# Patient Record
Sex: Male | Born: 1980 | Race: White | Hispanic: No | Marital: Single | Smoking: Never smoker
Health system: Southern US, Community
[De-identification: ages and names within clinical notes are randomized; demographics above are authoritative.]

## PROBLEM LIST (undated history)

## (undated) HISTORY — PX: ANKLE SURGERY: SHX546

---

## 2013-11-26 HISTORY — PX: OTHER SURGICAL HISTORY: SHX169

## 2017-02-08 ENCOUNTER — Emergency Department (HOSPITAL_COMMUNITY)
Admission: EM | Admit: 2017-02-08 | Discharge: 2017-02-08 | Disposition: A | Discharge status: Home / Self Care | Payer: BC Managed Care – PPO | Attending: Emergency Medicine | Admitting: Emergency Medicine

## 2017-02-08 ENCOUNTER — Ambulatory Visit (HOSPITAL_COMMUNITY)
Admission: EM | Admit: 2017-02-08 | Discharge: 2017-02-08 | Disposition: A | Discharge status: Home / Self Care | Payer: Self-pay

## 2017-02-08 ENCOUNTER — Emergency Department (HOSPITAL_COMMUNITY): Payer: BC Managed Care – PPO

## 2017-02-08 ENCOUNTER — Encounter (HOSPITAL_COMMUNITY): Payer: Self-pay

## 2017-02-08 DIAGNOSIS — Y999 Unspecified external cause status: Secondary | ICD-10-CM | POA: Diagnosis not present

## 2017-02-08 DIAGNOSIS — Y929 Unspecified place or not applicable: Secondary | ICD-10-CM | POA: Insufficient documentation

## 2017-02-08 DIAGNOSIS — S0993XA Unspecified injury of face, initial encounter: Secondary | ICD-10-CM | POA: Insufficient documentation

## 2017-02-08 DIAGNOSIS — W2103XA Struck by baseball, initial encounter: Secondary | ICD-10-CM | POA: Diagnosis not present

## 2017-02-08 DIAGNOSIS — S0990XA Unspecified injury of head, initial encounter: Secondary | ICD-10-CM | POA: Diagnosis present

## 2017-02-08 DIAGNOSIS — Y9364 Activity, baseball: Secondary | ICD-10-CM | POA: Diagnosis not present

## 2017-02-08 DIAGNOSIS — S0003XA Contusion of scalp, initial encounter: Secondary | ICD-10-CM

## 2017-02-08 MED ORDER — HYDROCODONE-ACETAMINOPHEN 5-325 MG PO TABS
1.0000 | ORAL_TABLET | Freq: Once | ORAL | Status: AC
  Administered 2017-02-08: 1 via ORAL
  Filled 2017-02-08: qty 1

## 2017-02-08 MED ORDER — HYDROCODONE-ACETAMINOPHEN 5-325 MG PO TABS: 1.0000 | ORAL_TABLET | Freq: Four times a day (QID) | ORAL | 0 refills | Status: DC | PRN

## 2017-02-08 MED ORDER — DICLOFENAC SODIUM 50 MG PO TBEC: 50.0000 mg | DELAYED_RELEASE_TABLET | Freq: Two times a day (BID) | ORAL | 0 refills | Status: DC
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
## 2017-02-08 NOTE — ED Notes (Signed)
EDP at bedside  

## 2017-02-08 NOTE — Discharge Instructions (Signed)
Return here immediately for persistent vomiting, change in vision, increased pain or other problems.
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
## 2018-05-12 DIAGNOSIS — K7581 Nonalcoholic steatohepatitis (NASH): Secondary | ICD-10-CM | POA: Insufficient documentation

## 2019-08-17 ENCOUNTER — Other Ambulatory Visit: Payer: Self-pay | Admitting: *Deleted

## 2019-08-17 DIAGNOSIS — Z20822 Contact with and (suspected) exposure to covid-19: Secondary | ICD-10-CM

## 2019-08-19 LAB — NOVEL CORONAVIRUS, NAA: SARS-CoV-2, NAA: DETECTED — AB

## 2023-11-30 ENCOUNTER — Encounter: Payer: Self-pay | Admitting: Family Medicine

## 2023-11-30 ENCOUNTER — Ambulatory Visit (INDEPENDENT_AMBULATORY_CARE_PROVIDER_SITE_OTHER): Payer: Commercial Managed Care - PPO | Admitting: Family Medicine

## 2023-11-30 VITALS — BP 118/82 | HR 81 | Temp 98.2°F | Ht 73.0 in | Wt 252.0 lb

## 2023-11-30 DIAGNOSIS — Z0001 Encounter for general adult medical examination with abnormal findings: Secondary | ICD-10-CM

## 2023-11-30 DIAGNOSIS — E66811 Obesity, class 1: Secondary | ICD-10-CM

## 2023-11-30 DIAGNOSIS — Z6833 Body mass index (BMI) 33.0-33.9, adult: Secondary | ICD-10-CM

## 2023-11-30 DIAGNOSIS — E6609 Other obesity due to excess calories: Secondary | ICD-10-CM

## 2023-11-30 DIAGNOSIS — Z7689 Persons encountering health services in other specified circumstances: Secondary | ICD-10-CM

## 2023-11-30 DIAGNOSIS — Z789 Other specified health status: Secondary | ICD-10-CM

## 2023-11-30 DIAGNOSIS — Z136 Encounter for screening for cardiovascular disorders: Secondary | ICD-10-CM

## 2023-11-30 DIAGNOSIS — K7581 Nonalcoholic steatohepatitis (NASH): Secondary | ICD-10-CM

## 2023-11-30 NOTE — Progress Notes (Signed)
 Cory Medina is a 43 y.o. male presents to office today for annual physical exam examination.    Concerns today include: 1. Establish care  Previously at [facility], then went to [facility]. Reports family history of aneurysm. Reports history of NASH, however reports ETOH use. Reports that he drinks on weekends, 12 pack per night on Friday, Saturday, Sunday.   Occupation: Owns teaching laboratory technician  Marital status: none  Diet: skips breakfast, eats out at [employer] for lunch. Wife cooks 3 nights per week and they eat out the rest. States that they are doing ball most nights per week.  Exercise: work  Substance use: ETOH  Quit smoking at 43 years old. Started at 15.  Last eye exam: none  Last dental exam: yes  Last colonoscopy: due at 45  Contraceptive: completed vasectomy  PSA: none  Other specialists seen: podiatry in [place]  Dermatology exam: none  Fasting today:  yes  Immunizations needed: Flu Vaccine: no  Tdap Vaccine: possibly due   - every 55yrs - (<3 lifetime doses or unknown): all wounds -- look up need for Tetanus IG - (>=3 lifetime doses): clean/minor wound if >52yrs from previous; all other wounds if >28yrs from previous Zoster Vaccine: not due  (those >50yo, once) Pneumonia Vaccine: not due  (those w/ risk factors) - (<58yr) Both: Immunocompromised, cochlear implant, CSF leak, asplenic, sickle cell, Chronic Renal Failure - (<67yr) PPSV-23 only: Heart dz, lung disease, DM, tobacco abuse, alcoholism, cirrhosis/liver disease. - (>38yr): PPSV13 then PPSV23 in 6-12mths;  - (>17yr): repeat PPSV23 once if pt received prior to 43yo and 4yrs have passed   History reviewed. No pertinent past medical history. Social History   Socioeconomic History   Marital status: Single    Spouse name: Not on file   Number of children: Not on file   Years of education: Not on file   Highest education level: Not on file  Occupational History   Not on file  Tobacco Use   Smoking  status: Never   Smokeless tobacco: Current    Types: Chew  Substance and Sexual Activity   Alcohol use: Yes    Alcohol/week: 14.0 standard drinks of alcohol    Types: 14 Cans of beer per week    Comment: Two Beers    Drug use: No   Sexual activity: Not on file  Other Topics Concern   Not on file  Social History Narrative   Not on file   Social Drivers of Health   Financial Resource Strain: Low Risk  (05/06/2022)   Received from [facility]   Overall Financial Resource Strain (CARDIA)    Difficulty of Paying Living Expenses: Not hard at all  Food Insecurity: No Food Insecurity (11/30/2023)   Hunger Vital Sign    Worried About Running Out of Food in the Last Year: Never true    Ran Out of Food in the Last Year: Never true  Transportation Needs: No Transportation Needs (11/30/2023)   PRAPARE - Administrator, Civil Service (Medical): No    Lack of Transportation (Non-Medical): No  Physical Activity: Not on file  Stress: Not on file  Social Connections: Not on file  Intimate Partner Violence: Not At Risk (11/30/2023)   Humiliation, Afraid, Rape, and Kick questionnaire    Fear of Current or Ex-Partner: No    Emotionally Abused: No    Physically Abused: No    Sexually Abused: No   Past Surgical History:  Procedure  Laterality Date   ANKLE SURGERY     History reviewed. No pertinent family history. No current outpatient medications on file.  No Known Allergies   ROS: Review of Systems Review of Systems  All other systems reviewed and are negative.    Physical exam    11/30/2023    8:03 AM 02/08/2017    9:47 PM 02/08/2017    6:50 PM  Vitals with BMI  Height 6' 1  6' 1  Weight 252 lbs  224 lbs  BMI 33.25  29.6  Systolic 118 131 868  Diastolic 82 85 94  Pulse 81 56 71    Physical Exam Constitutional:      General: He is awake. He is not in acute distress.    Appearance: Normal appearance. He is well-developed and well-groomed. He is obese. He is not  ill-appearing, toxic-appearing or diaphoretic.     Interventions: He is not intubated. HENT:     Head:     Salivary Glands: Right salivary gland is not diffusely enlarged or tender. Left salivary gland is not diffusely enlarged or tender.     Right Ear: No laceration, drainage, swelling or tenderness. No middle ear effusion. There is no impacted cerumen. No foreign body. No mastoid tenderness. No PE tube. No hemotympanum. Tympanic membrane is not injected, scarred, perforated, erythematous, retracted or bulging.     Left Ear: No laceration, drainage, swelling or tenderness.  No middle ear effusion. There is no impacted cerumen. No foreign body. No mastoid tenderness. No PE tube. No hemotympanum. Tympanic membrane is not injected, scarred, perforated, erythematous, retracted or bulging.     Nose: No nasal deformity, septal deviation, signs of injury, laceration, nasal tenderness, mucosal edema, congestion or rhinorrhea.     Right Sinus: No maxillary sinus tenderness or frontal sinus tenderness.     Left Sinus: No maxillary sinus tenderness or frontal sinus tenderness.  Eyes:     General: Lids are normal.     Extraocular Movements:     Right eye: Normal extraocular motion.     Left eye: Normal extraocular motion.     Conjunctiva/sclera:     Right eye: Right conjunctiva is not injected. No chemosis, exudate or hemorrhage.    Left eye: Left conjunctiva is not injected. No chemosis, exudate or hemorrhage.    Pupils: Pupils are equal, round, and reactive to light. Pupils are equal.     Right eye: Pupil is round, reactive and not sluggish. No corneal abrasion.     Left eye: Pupil is round, reactive and not sluggish. No corneal abrasion.     Funduscopic exam:    Right eye: No hemorrhage or exudate. Red reflex present.        Left eye: No hemorrhage or exudate. Red reflex present. Neck:     Thyroid: No thyroid mass or thyromegaly.     Vascular: No carotid bruit.     Trachea: Trachea and phonation  normal. No tracheal tenderness, tracheostomy or tracheal deviation.  Cardiovascular:     Rate and Rhythm: Normal rate and regular rhythm.     Pulses: Normal pulses.          Radial pulses are 2+ on the right side and 2+ on the left side.       Posterior tibial pulses are 2+ on the right side and 2+ on the left side.     Heart sounds: Normal heart sounds. No murmur heard.    No gallop.  Pulmonary:  Effort: Pulmonary effort is normal. No tachypnea, bradypnea, accessory muscle usage, prolonged expiration, respiratory distress or retractions. He is not intubated.     Breath sounds: Normal breath sounds. No stridor, decreased air movement or transmitted upper airway sounds. No decreased breath sounds, wheezing, rhonchi or rales.  Abdominal:     General: Abdomen is flat. Bowel sounds are normal. There is no distension or abdominal bruit. There are no signs of injury.     Palpations: Abdomen is soft. There is no shifting dullness, fluid wave, hepatomegaly, splenomegaly, mass or pulsatile mass.     Tenderness: There is no abdominal tenderness. There is no right CVA tenderness, left CVA tenderness, guarding or rebound.     Hernia: No hernia is present.  Musculoskeletal:     Cervical back: Full passive range of motion without pain, normal range of motion and neck supple. No edema, erythema, rigidity, torticollis or crepitus. No pain with movement. Normal range of motion.     Right lower leg: No edema.     Left lower leg: No edema.  Lymphadenopathy:     Head:     Right side of head: No submental, submandibular, tonsillar or preauricular adenopathy.     Left side of head: No submental, submandibular, tonsillar or preauricular adenopathy.     Cervical: No cervical adenopathy.     Right cervical: No superficial, deep or posterior cervical adenopathy.    Left cervical: No superficial, deep or posterior cervical adenopathy.  Skin:    General: Skin is warm.     Capillary Refill: Capillary refill takes  less than 2 seconds.  Neurological:     General: No focal deficit present.     Mental Status: He is alert, oriented to person, place, and time and easily aroused. Mental status is at baseline.     GCS: GCS eye subscore is 4. GCS verbal subscore is 5. GCS motor subscore is 6.     Cranial Nerves: Cranial nerves 2-12 are intact. No cranial nerve deficit or facial asymmetry.     Motor: Motor function is intact. No weakness.     Gait: Gait is intact.  Psychiatric:        Attention and Perception: Attention and perception normal.        Mood and Affect: Mood and affect normal.        Speech: Speech normal.        Behavior: Behavior normal. Behavior is cooperative.        Thought Content: Thought content normal. Thought content does not include homicidal or suicidal ideation. Thought content does not include homicidal or suicidal plan.        Cognition and Memory: Cognition and memory normal.        Judgment: Judgment normal.       11/30/2023    8:15 AM  Depression screen PHQ 2/9  Decreased Interest 0  Down, Depressed, Hopeless 0  PHQ - 2 Score 0  Altered sleeping 2  Tired, decreased energy 1  Change in appetite 0  Feeling bad or failure about yourself  0  Trouble concentrating 0  Moving slowly or fidgety/restless 0  Suicidal thoughts 0  PHQ-9 Score 3  Difficult doing work/chores Somewhat difficult      11/30/2023    8:17 AM  GAD 7 : Generalized Anxiety Score  Nervous, Anxious, on Edge 0  Control/stop worrying 0  Worry too much - different things 1  Trouble relaxing 0  Restless 0  Easily annoyed or irritable  0  Afraid - awful might happen 0  Total GAD 7 Score 1  Anxiety Difficulty Not difficult at all   Assessment/ Plan: Alm DELENA Begin here for annual physical exam.  1. Encounter to establish care (Primary) Discussed with patient to continue healthy lifestyle choices, including diet (rich in fruits, vegetables, and lean proteins, and low in salt and simple carbohydrates) and  exercise (at least 30 minutes of moderate physical activity daily). Limit beverages high is sugar. Recommended at least 80-100 oz of water daily.   2. BMI 33.0-33.9,adult As above. Labs as below. Will communicate results to patient once available. Will await results to determine next steps.  - CBC with Differential/Platelet - CMP14+EGFR - TSH - VITAMIN D  25 Hydroxy (Vit-D Deficiency, Fractures)  3. Class 1 obesity due to excess calories without serious comorbidity with body mass index (BMI) of 33.0 to 33.9 in adult Labs as below. Will communicate results to patient once available. Will await results to determine next steps.  - CBC with Differential/Platelet - CMP14+EGFR - TSH - VITAMIN D  25 Hydroxy (Vit-D Deficiency, Fractures)  4. Encounter for screening for cardiovascular disorders Labs as below. Will communicate results to patient once available. Will await results to determine next steps.  Fasting  - Lipid panel  5. NASH (nonalcoholic steatohepatitis) Praised patient on cutting back, encouraged him to continue to cut back. Do not have records of biopsy or US  of liver. Will collect labs and determine next steps.  - CBC with Differential/Platelet - CMP14+EGFR - TSH - VITAMIN D  25 Hydroxy (Vit-D Deficiency, Fractures)  6. Alcohol consumption heavy As above.  - CBC with Differential/Platelet - CMP14+EGFR - TSH - VITAMIN D  25 Hydroxy (Vit-D Deficiency, Fractures)   Patient to follow up in 1 year for annual exam or sooner if needed.  The above assessment and management plan was discussed with the patient. The patient verbalized understanding of and has agreed to the management plan. Patient is aware to call the clinic if symptoms persist or worsen. Patient is aware when to return to the clinic for a follow-up visit. Patient educated on when it is appropriate to go to the emergency department.   Marry Kins, DNP-FNP [facility] [address] [place], [place] [address] [phone]

## 2023-12-01 ENCOUNTER — Encounter: Payer: Self-pay | Admitting: Family Medicine

## 2023-12-01 LAB — CMP14+EGFR
ALT: 53 [IU]/L — ABNORMAL HIGH (ref 0–44)
AST: 21 [IU]/L (ref 0–40)
Albumin: 4.6 g/dL (ref 4.1–5.1)
Alkaline Phosphatase: 59 [IU]/L (ref 44–121)
BUN/Creatinine Ratio: 14 (ref 9–20)
BUN: 14 mg/dL (ref 6–24)
Bilirubin Total: 0.5 mg/dL (ref 0.0–1.2)
CO2: 22 mmol/L (ref 20–29)
Calcium: 9.6 mg/dL (ref 8.7–10.2)
Chloride: 102 mmol/L (ref 96–106)
Creatinine, Ser: 1 mg/dL (ref 0.76–1.27)
Globulin, Total: 2 g/dL (ref 1.5–4.5)
Glucose: 115 mg/dL — ABNORMAL HIGH (ref 70–99)
Potassium: 4.5 mmol/L (ref 3.5–5.2)
Sodium: 138 mmol/L (ref 134–144)
Total Protein: 6.6 g/dL (ref 6.0–8.5)
eGFR: 96 mL/min/{1.73_m2} (ref >59)

## 2023-12-01 LAB — CBC WITH DIFFERENTIAL/PLATELET
Basophils Absolute: 0 10*3/uL (ref 0.0–0.2)
Basos: 1 %
EOS (ABSOLUTE): 0.1 10*3/uL (ref 0.0–0.4)
Eos: 1 %
Hematocrit: 47.3 % (ref 37.5–51.0)
Hemoglobin: 15.7 g/dL (ref 13.0–17.7)
Immature Grans (Abs): 0 10*3/uL (ref 0.0–0.1)
Immature Granulocytes: 0 %
Lymphocytes Absolute: 1.9 10*3/uL (ref 0.7–3.1)
Lymphs: 32 %
MCH: 30.9 pg (ref 26.6–33.0)
MCHC: 33.2 g/dL (ref 31.5–35.7)
MCV: 93 fL (ref 79–97)
Monocytes Absolute: 0.7 10*3/uL (ref 0.1–0.9)
Monocytes: 12 %
Neutrophils Absolute: 3.3 10*3/uL (ref 1.4–7.0)
Neutrophils: 54 %
Platelets: 222 10*3/uL (ref 150–450)
RBC: 5.08 x10E6/uL (ref 4.14–5.80)
RDW: 11.7 % (ref 11.6–15.4)
WBC: 6 10*3/uL (ref 3.4–10.8)

## 2023-12-01 LAB — LIPID PANEL
Chol/HDL Ratio: 4.2 {ratio} (ref 0.0–5.0)
Cholesterol, Total: 209 mg/dL — ABNORMAL HIGH (ref 100–199)
HDL: 50 mg/dL (ref >39)
LDL Chol Calc (NIH): 138 mg/dL — ABNORMAL HIGH (ref 0–99)
Triglycerides: 117 mg/dL (ref 0–149)
VLDL Cholesterol Cal: 21 mg/dL (ref 5–40)

## 2023-12-01 LAB — TSH: TSH: 1.8 u[IU]/mL (ref 0.450–4.500)

## 2023-12-01 LAB — VITAMIN D 25 HYDROXY (VIT D DEFICIENCY, FRACTURES): Vit D, 25-Hydroxy: 23.3 ng/mL — ABNORMAL LOW (ref 30.0–100.0)

## 2023-12-01 NOTE — Progress Notes (Signed)
 Slightly elevated glucose. Can we add on A1C? Slightly elevated ALT. Will continue to monitor on future  labs. Recommend to avoid liver toxins such as tylenol  and alcohol. If they remain elevated, I recommend getting an US . Cholesterol is slightly elevated. Diet encouraged - increase intake of fresh fruits and vegetables, increase intake of lean proteins. Bake, broil, or grill foods. Avoid fried, greasy, and fatty foods. Avoid fast foods. Increase intake of fiber-rich whole grains. Exercise encouraged - at least 150 minutes per week and advance as tolerated. Can also try red yeast rice and we will recheck in 3 months. Based on ASCVD risk score, not recommended to start a statin at this time. Vitamin D  slightly low. Recommend taking [phone] international units daily.   The 10-year ASCVD risk score (Arnett DK, et al., 2019) is: 1.3%

## 2023-12-02 LAB — HGB A1C W/O EAG: Hgb A1c MFr Bld: 5.7 % — ABNORMAL HIGH (ref 4.8–5.6)

## 2023-12-02 LAB — SPECIMEN STATUS REPORT

## 2023-12-05 NOTE — Progress Notes (Signed)
 A1C in prediabetes range. Recommendhealthy lifestyle choices, including diet (rich in fruits, vegetables, and lean proteins, and low in salt and simple carbohydrates) and exercise (at least 30 minutes of moderate physical activity daily). Limit beverages high is sugar. Recommend at least 80-100 oz of water daily.

## 2024-02-24 ENCOUNTER — Encounter: Payer: Self-pay | Admitting: *Deleted

## 2024-02-29 ENCOUNTER — Ambulatory Visit (INDEPENDENT_AMBULATORY_CARE_PROVIDER_SITE_OTHER): Payer: Commercial Managed Care - PPO | Admitting: Family Medicine

## 2024-02-29 VITALS — BP 122/84 | HR 77 | Temp 98.0°F | Ht 73.0 in | Wt 227.0 lb

## 2024-02-29 DIAGNOSIS — E66811 Obesity, class 1: Secondary | ICD-10-CM | POA: Diagnosis not present

## 2024-02-29 DIAGNOSIS — R748 Abnormal levels of other serum enzymes: Secondary | ICD-10-CM

## 2024-02-29 DIAGNOSIS — E559 Vitamin D deficiency, unspecified: Secondary | ICD-10-CM | POA: Diagnosis not present

## 2024-02-29 DIAGNOSIS — R7303 Prediabetes: Secondary | ICD-10-CM

## 2024-02-29 DIAGNOSIS — E782 Mixed hyperlipidemia: Secondary | ICD-10-CM

## 2024-02-29 DIAGNOSIS — E6609 Other obesity due to excess calories: Secondary | ICD-10-CM

## 2024-02-29 DIAGNOSIS — Z6833 Body mass index (BMI) 33.0-33.9, adult: Secondary | ICD-10-CM

## 2024-02-29 DIAGNOSIS — Z789 Other specified health status: Secondary | ICD-10-CM

## 2024-02-29 DIAGNOSIS — K7581 Nonalcoholic steatohepatitis (NASH): Secondary | ICD-10-CM

## 2024-02-29 LAB — BAYER DCA HB A1C WAIVED: HB A1C (BAYER DCA - WAIVED): 5.2 % (ref 4.8–5.6)

## 2024-02-29 NOTE — Progress Notes (Signed)
 Subjective:  Patient ID: Cory Medina, male    DOB: 10/07/1981, 43 y.o.   MRN: [id number]  Patient Care Team: Chrystine Crate, FNP as PCP - General (Family Medicine)   Chief Complaint:  Medical Management of Chronic Issues  HPI: Cory Medina is a 43 y.o. male presenting on 02/29/2024 for Medical Management of Chronic Issues  HPI 1. Prediabetes States that he is working on diet and exercise.  He has cut out fast food during the week, soda, and bread.  He is running 1.5 - 2 miles 3x per week.   2. Mixed hyperlipidemia He is not taking RYR.  Denies syncope, chest pain, shortness of breath, sweating.   3. Vitamin D  insufficiency He is not taking a supplement. He states that he does have numbness and tingling in his hands daily. Has some knee pain. Denies fracture.   4. Alcohol  States that he is trying to cut back. States that he drinks one night during the week usually 4-5 beer, on weekend 12 pack.   Relevant past medical, surgical, family, and social history reviewed and updated as indicated.  Allergies and medications reviewed and updated. Data reviewed: Chart in Epic.   History reviewed. No pertinent past medical history.  Past Surgical History:  Procedure Laterality Date   ANKLE SURGERY     vasectomy Bilateral 11/26/2013    Social History   Socioeconomic History   Marital status: Single    Spouse name: Not on file   Number of children: Not on file   Years of education: Not on file   Highest education level: 12th grade  Occupational History   Not on file  Tobacco Use   Smoking status: Former    Types: Cigarettes   Smokeless tobacco: Never  Substance and Sexual Activity   Alcohol use: Yes    Alcohol/week: 14.0 standard drinks of alcohol    Types: 14 Cans of beer per week    Comment: Two Beers    Drug use: No   Sexual activity: Not on file  Other Topics Concern   Not on file  Social History Narrative   Not on file   Social Drivers of  Health   Financial Resource Strain: Low Risk  (02/29/2024)   Overall Financial Resource Strain (CARDIA)    Difficulty of Paying Living Expenses: Not hard at all  Food Insecurity: Unknown (02/29/2024)   Hunger Vital Sign    Worried About Running Out of Food in the Last Year: Never true    Ran Out of Food in the Last Year: Patient declined  Transportation Needs: No Transportation Needs (02/29/2024)   PRAPARE - Administrator, Civil Service (Medical): No    Lack of Transportation (Non-Medical): No  Physical Activity: Insufficiently Active (02/29/2024)   Exercise Vital Sign    Days of Exercise per Week: 3 days    Minutes of Exercise per Session: 20 min  Stress: No Stress Concern Present (02/29/2024)   [employer] of Occupational Health - Occupational Stress Questionnaire    Feeling of Stress : Only a little  Social Connections: Moderately Integrated (02/29/2024)   Social Connection and Isolation Panel [NHANES]    Frequency of Communication with Friends and Family: More than three times a week    Frequency of Social Gatherings with Friends and Family: Three times a week    Attends Religious Services: More than 4 times per year    Active Member of Clubs or Organizations: No  Attends Banker Meetings: Not on file    Marital Status: Married  Intimate Partner Violence: Not At Risk (11/30/2023)   Humiliation, Afraid, Rape, and Kick questionnaire    Fear of Current or Ex-Partner: No    Emotionally Abused: No    Physically Abused: No    Sexually Abused: No    No outpatient encounter medications on file as of 02/29/2024.   No facility-administered encounter medications on file as of 02/29/2024.    No Known Allergies  Review of Systems As per HPI  Objective:  BP 122/84   Pulse 77   Temp 98 F (36.7 C)   Ht 6\' 1"  (1.854 m)   Wt 227 lb (103 kg)   SpO2 97%   BMI 29.95 kg/m    Wt Readings from Last 3 Encounters:  02/29/24 227 lb (103 kg)  11/30/23 252 lb (114.3  kg)  02/08/17 224 lb (101.6 kg)   Physical Exam Constitutional:      General: He is awake. He is not in acute distress.    Appearance: Normal appearance. He is well-developed, well-groomed and overweight. He is not ill-appearing, toxic-appearing or diaphoretic.  HENT:     Nose: No congestion or rhinorrhea.     Mouth/Throat:     Lips: Pink. No lesions.  Eyes:     Extraocular Movements:     Right eye: Normal extraocular motion.     Left eye: Normal extraocular motion.     Pupils: Pupils are equal, round, and reactive to light.  Cardiovascular:     Rate and Rhythm: Normal rate and regular rhythm.     Pulses: Normal pulses.          Radial pulses are 2+ on the right side and 2+ on the left side.       Posterior tibial pulses are 2+ on the right side and 2+ on the left side.     Heart sounds: Normal heart sounds. No murmur heard.    No gallop.  Pulmonary:     Effort: Pulmonary effort is normal. No respiratory distress.     Breath sounds: Normal breath sounds. No stridor. No wheezing, rhonchi or rales.  Musculoskeletal:     Cervical back: Full passive range of motion without pain and neck supple.     Right lower leg: No edema.     Left lower leg: No edema.  Skin:    General: Skin is warm.     Capillary Refill: Capillary refill takes less than 2 seconds.  Neurological:     General: No focal deficit present.     Mental Status: He is alert, oriented to person, place, and time and easily aroused. Mental status is at baseline.     GCS: GCS eye subscore is 4. GCS verbal subscore is 5. GCS motor subscore is 6.     Motor: No weakness.  Psychiatric:        Attention and Perception: Attention and perception normal.        Mood and Affect: Mood and affect normal.        Speech: Speech normal.        Behavior: Behavior normal. Behavior is cooperative.        Thought Content: Thought content normal. Thought content does not include homicidal or suicidal ideation. Thought content does not  include homicidal or suicidal plan.        Cognition and Memory: Cognition and memory normal.        Judgment: Judgment  normal.     Results for orders placed or performed in visit on 11/30/23  CBC with Differential/Platelet   Collection Time: 11/30/23  8:33 AM  Result Value Ref Range   WBC 6.0 3.4 - 10.8 x10E3/uL   RBC 5.08 4.14 - 5.80 x10E6/uL   Hemoglobin 15.7 13.0 - 17.7 g/dL   Hematocrit 13.0 86.5 - 51.0 %   MCV 93 79 - 97 fL   MCH 30.9 26.6 - 33.0 pg   MCHC 33.2 31.5 - 35.7 g/dL   RDW 78.4 69.6 - 29.5 %   Platelets 222 150 - 450 x10E3/uL   Neutrophils 54 Not Estab. %   Lymphs 32 Not Estab. %   Monocytes 12 Not Estab. %   Eos 1 Not Estab. %   Basos 1 Not Estab. %   Neutrophils Absolute 3.3 1.4 - 7.0 x10E3/uL   Lymphocytes Absolute 1.9 0.7 - 3.1 x10E3/uL   Monocytes Absolute 0.7 0.1 - 0.9 x10E3/uL   EOS (ABSOLUTE) 0.1 0.0 - 0.4 x10E3/uL   Basophils Absolute 0.0 0.0 - 0.2 x10E3/uL   Immature Granulocytes 0 Not Estab. %   Immature Grans (Abs) 0.0 0.0 - 0.1 x10E3/uL  CMP14+EGFR   Collection Time: 11/30/23  8:33 AM  Result Value Ref Range   Glucose 115 (H) 70 - 99 mg/dL   BUN 14 6 - 24 mg/dL   Creatinine, Ser 2.84 0.76 - 1.27 mg/dL   eGFR 96 >13 KG/MWN/0.27   BUN/Creatinine Ratio 14 9 - 20   Sodium 138 134 - 144 mmol/L   Potassium 4.5 3.5 - 5.2 mmol/L   Chloride 102 96 - 106 mmol/L   CO2 22 20 - 29 mmol/L   Calcium 9.6 8.7 - 10.2 mg/dL   Total Protein 6.6 6.0 - 8.5 g/dL   Albumin 4.6 4.1 - 5.1 g/dL   Globulin, Total 2.0 1.5 - 4.5 g/dL   Bilirubin Total 0.5 0.0 - 1.2 mg/dL   Alkaline Phosphatase 59 44 - 121 IU/L   AST 21 0 - 40 IU/L   ALT 53 (H) 0 - 44 IU/L  Lipid panel   Collection Time: 11/30/23  8:33 AM  Result Value Ref Range   Cholesterol, Total 209 (H) 100 - 199 mg/dL   Triglycerides 253 0 - 149 mg/dL   HDL 50 >66 mg/dL   VLDL Cholesterol Cal 21 5 - 40 mg/dL   LDL Chol Calc (NIH) 440 (H) 0 - 99 mg/dL   Chol/HDL Ratio 4.2 0.0 - 5.0 ratio  TSH    Collection Time: 11/30/23  8:33 AM  Result Value Ref Range   TSH 1.800 0.450 - 4.500 uIU/mL  VITAMIN D  25 Hydroxy (Vit-D Deficiency, Fractures)   Collection Time: 11/30/23  8:33 AM  Result Value Ref Range   Vit D, 25-Hydroxy 23.3 (L) 30.0 - 100.0 ng/mL  Hgb A1c w/o eAG   Collection Time: 11/30/23  8:33 AM  Result Value Ref Range   Hgb A1c MFr Bld 5.7 (H) 4.8 - 5.6 %  Specimen status report   Collection Time: 11/30/23  8:33 AM  Result Value Ref Range   specimen status report Comment        02/29/2024    8:08 AM 11/30/2023    8:15 AM  Depression screen PHQ 2/9  Decreased Interest 0 0  Down, Depressed, Hopeless 0 0  PHQ - 2 Score 0 0  Altered sleeping 0 2  Tired, decreased energy 1 1  Change in appetite 0 0  Feeling bad  or failure about yourself  0 0  Trouble concentrating 0 0  Moving slowly or fidgety/restless 0 0  Suicidal thoughts 0 0  PHQ-9 Score 1 3  Difficult doing work/chores Not difficult at all Somewhat difficult       02/29/2024    8:08 AM 11/30/2023    8:17 AM  GAD 7 : Generalized Anxiety Score  Nervous, Anxious, on Edge 0 0  Control/stop worrying 0 0  Worry too much - different things 1 1  Trouble relaxing 0 0  Restless 0 0  Easily annoyed or irritable 0 0  Afraid - awful might happen 0 0  Total GAD 7 Score 1 1  Anxiety Difficulty Not difficult at all Not difficult at all    The 10-year ASCVD risk score (Arnett DK, et al., 2019) is: 1.4%   Values used to calculate the score:     Age: 70 years     Sex: Male     Is Non-Hispanic African American: No     Diabetic: No     Tobacco smoker: No     Systolic Blood Pressure: 122 mmHg     Is BP treated: No     HDL Cholesterol: 50 mg/dL     Total Cholesterol: 209 mg/dL   Pertinent labs & imaging results that were available during my care of the patient were reviewed by me and considered in my medical decision making.  Assessment & Plan:  Carlie was seen today for medical management of chronic issues.  Diagnoses  and all orders for this visit:  1. Prediabetes (Primary) Labs as below. Will communicate results to patient once available. Will await results to determine next steps.  Praised patient on the work that he is doing with diet and lifestyle modifications.  - Bayer DCA Hb A1c Waived  2. Mixed hyperlipidemia Labs as below. Will communicate results to patient once available. Will await results to determine next steps.  Fasting - Lipid panel  3. Vitamin D  insufficiency Labs as below. Will communicate results to patient once available. Will await results to determine next steps. Encouraged patient to start supplement.  - VITAMIN D  25 Hydroxy (Vit-D Deficiency, Fractures)  4. Class 1 obesity due to excess calories without serious comorbidity with body mass index (BMI) of 33.0 to 33.9 in adult Labs as below. Will communicate results to patient once available. Will await results to determine next steps.  Praised patient on the work that he is doing with diet and lifestyle modifications.  - CMP14+EGFR - CBC with Differential/Platelet  5. NASH (nonalcoholic steatohepatitis) Labs as below. Given alcohol use, will await results of labs and consider referral to GI for further evaluation to clarify diagnosis.  - CMP14+EGFR - CBC with Differential/Platelet  6. Alcohol consumption heavy Encouraged patient to decrease consumption. As above, will await labs and consider referral to GI to confirm diagnosis.  - CMP14+EGFR - CBC with Differential/Platelet     Continue all other maintenance medications.  Follow up plan: Return in about 6 months (around 08/31/2024) for Chronic Condition Follow up.   Continue healthy lifestyle choices, including diet (rich in fruits, vegetables, and lean proteins, and low in salt and simple carbohydrates) and exercise (at least 30 minutes of moderate physical activity daily).  Written and verbal instructions provided   The above assessment and management plan was  discussed with the patient. The patient verbalized understanding of and has agreed to the management plan. Patient is aware to call the clinic if they develop  any new symptoms or if symptoms persist or worsen. Patient is aware when to return to the clinic for a follow-up visit. Patient educated on when it is appropriate to go to the emergency department.   Jacqualyn Mates, DNP-FNP [facility] [address] [place], [place] [address] [phone]

## 2024-03-01 ENCOUNTER — Encounter: Payer: Self-pay | Admitting: Family Medicine

## 2024-03-01 LAB — CBC WITH DIFFERENTIAL/PLATELET
Basophils Absolute: 0 10*3/uL (ref 0.0–0.2)
Basos: 1 %
EOS (ABSOLUTE): 0.1 10*3/uL (ref 0.0–0.4)
Eos: 2 %
Hematocrit: 45.5 % (ref 37.5–51.0)
Hemoglobin: 15.2 g/dL (ref 13.0–17.7)
Immature Grans (Abs): 0 10*3/uL (ref 0.0–0.1)
Immature Granulocytes: 0 %
Lymphocytes Absolute: 1.8 10*3/uL (ref 0.7–3.1)
Lymphs: 28 %
MCH: 30.6 pg (ref 26.6–33.0)
MCHC: 33.4 g/dL (ref 31.5–35.7)
MCV: 92 fL (ref 79–97)
Monocytes Absolute: 0.8 10*3/uL (ref 0.1–0.9)
Monocytes: 12 %
Neutrophils Absolute: 3.7 10*3/uL (ref 1.4–7.0)
Neutrophils: 57 %
Platelets: 219 10*3/uL (ref 150–450)
RBC: 4.96 x10E6/uL (ref 4.14–5.80)
RDW: 11.5 % — ABNORMAL LOW (ref 11.6–15.4)
WBC: 6.4 10*3/uL (ref 3.4–10.8)

## 2024-03-01 LAB — CMP14+EGFR
ALT: 47 IU/L — ABNORMAL HIGH (ref 0–44)
AST: 28 IU/L (ref 0–40)
Albumin: 4.5 g/dL (ref 4.1–5.1)
Alkaline Phosphatase: 60 IU/L (ref 44–121)
BUN/Creatinine Ratio: 16 (ref 9–20)
BUN: 15 mg/dL (ref 6–24)
Bilirubin Total: 0.6 mg/dL (ref 0.0–1.2)
CO2: 23 mmol/L (ref 20–29)
Calcium: 9.1 mg/dL (ref 8.7–10.2)
Chloride: 102 mmol/L (ref 96–106)
Creatinine, Ser: 0.96 mg/dL (ref 0.76–1.27)
Globulin, Total: 1.9 g/dL (ref 1.5–4.5)
Glucose: 112 mg/dL — ABNORMAL HIGH (ref 70–99)
Potassium: 4.6 mmol/L (ref 3.5–5.2)
Sodium: 139 mmol/L (ref 134–144)
Total Protein: 6.4 g/dL (ref 6.0–8.5)
eGFR: 101 mL/min/{1.73_m2} (ref >59)

## 2024-03-01 LAB — LIPID PANEL
Chol/HDL Ratio: 3.7 ratio (ref 0.0–5.0)
Cholesterol, Total: 164 mg/dL (ref 100–199)
HDL: 44 mg/dL (ref >39)
LDL Chol Calc (NIH): 100 mg/dL — ABNORMAL HIGH (ref 0–99)
Triglycerides: 107 mg/dL (ref 0–149)
VLDL Cholesterol Cal: 20 mg/dL (ref 5–40)

## 2024-03-01 LAB — VITAMIN D 25 HYDROXY (VIT D DEFICIENCY, FRACTURES): Vit D, 25-Hydroxy: 36.6 ng/mL (ref 30.0–100.0)

## 2024-03-01 NOTE — Addendum Note (Signed)
 Addended by: Jacqualyn Mates on: 03/01/2024 12:57 PM   Modules accepted: Orders

## 2024-03-01 NOTE — Progress Notes (Signed)
 Slightly elevated BG, however A1C is in range. Slightly decreased RDW, not concerning at this time. Cholesterol is slightly elevated. Diet encouraged - increase intake of fresh fruits and vegetables, increase intake of lean proteins. Bake, broil, or grill foods. Avoid fried, greasy, and fatty foods. Avoid fast foods. Increase intake of fiber-rich whole grains. Exercise encouraged - at least 150 minutes per week and advance as tolerated. Can also try red yeast rice and we will recheck in 3 months. The 10-year ASCVD risk score (Arnett DK, et al., 2019) is: 1.1%.  Slightly elevated ALT. Given patient reported history of NASH, would like for him to follow up with GI, will place referral.

## 2024-03-06 ENCOUNTER — Ambulatory Visit: Payer: Self-pay

## 2024-04-09 NOTE — Progress Notes (Signed)
 Referring Provider: Chrystine Crate, FNP Primary Care Physician:  Chrystine Crate, FNP Primary Gastroenterologist:  Dr. Riley Cheadle***  No chief complaint on file.   HPI:   Cory Medina is a 43 y.o. male presenting today at the request of Milian, Winda Hastings, FNP for elevated LFTs.   Reviewed recent labs 02/29/2024.  ALT elevated at 47, otherwise LFTs, bilirubin, albumin within normal limits.  Platelets within normal limits.  Hemoglobin A1c 5.2.  No recent abdominal imaging on file.   Today:    No past medical history on file.  Past Surgical History:  Procedure Laterality Date   ANKLE SURGERY     vasectomy Bilateral 11/26/2013    No current outpatient medications on file.   No current facility-administered medications for this visit.    Allergies as of 04/11/2024   (No Known Allergies)    No family history on file.  Social History   Socioeconomic History   Marital status: Single    Spouse name: Not on file   Number of children: Not on file   Years of education: Not on file   Highest education level: 12th grade  Occupational History   Not on file  Tobacco Use   Smoking status: Former    Types: Cigarettes   Smokeless tobacco: Never  Substance and Sexual Activity   Alcohol use: Yes    Alcohol/week: 14.0 standard drinks of alcohol    Types: 14 Cans of beer per week    Comment: Two Beers    Drug use: No   Sexual activity: Not on file  Other Topics Concern   Not on file  Social History Narrative   Not on file   Social Drivers of Health   Financial Resource Strain: Low Risk  (02/29/2024)   Overall Financial Resource Strain (CARDIA)    Difficulty of Paying Living Expenses: Not hard at all  Food Insecurity: Unknown (02/29/2024)   Hunger Vital Sign    Worried About Running Out of Food in the Last Year: Never true    Ran Out of Food in the Last Year: Patient declined  Transportation Needs: No Transportation Needs (02/29/2024)   PRAPARE -  Administrator, Civil Service (Medical): No    Lack of Transportation (Non-Medical): No  Physical Activity: Insufficiently Active (02/29/2024)   Exercise Vital Sign    Days of Exercise per Week: 3 days    Minutes of Exercise per Session: 20 min  Stress: No Stress Concern Present (02/29/2024)   [employer] of Occupational Health - Occupational Stress Questionnaire    Feeling of Stress : Only a little  Social Connections: Moderately Integrated (02/29/2024)   Social Connection and Isolation Panel    Frequency of Communication with Friends and Family: More than three times a week    Frequency of Social Gatherings with Friends and Family: Three times a week    Attends Religious Services: More than 4 times per year    Active Member of Clubs or Organizations: No    Attends Banker Meetings: Not on file    Marital Status: Married  Catering manager Violence: Not At Risk (11/30/2023)   Humiliation, Afraid, Rape, and Kick questionnaire    Fear of Current or Ex-Partner: No    Emotionally Abused: No    Physically Abused: No    Sexually Abused: No    Review of Systems: Gen: Denies any fever, chills, fatigue, weight loss, lack of appetite.  CV: Denies chest pain, heart palpitations,  peripheral edema, syncope.  Resp: Denies shortness of breath at rest or with exertion. Denies wheezing or cough.  GI: Denies dysphagia or odynophagia. Denies jaundice, hematemesis, fecal incontinence. GU : Denies urinary burning, urinary frequency, urinary hesitancy MS: Denies joint pain, muscle weakness, cramps, or limitation of movement.  Derm: Denies rash, itching, dry skin Psych: Denies depression, anxiety, memory loss, and confusion Heme: Denies bruising, bleeding, and enlarged lymph nodes.  Physical Exam: There were no vitals taken for this visit. General:   Alert and oriented. Pleasant and cooperative. Well-nourished and well-developed.  Head:  Normocephalic and atraumatic. Eyes:   Without icterus, sclera clear and conjunctiva pink.  Ears:  Normal auditory acuity. Lungs:  Clear to auscultation bilaterally. No wheezes, rales, or rhonchi. No distress.  Heart:  S1, S2 present without murmurs appreciated.  Abdomen:  +BS, soft, non-tender and non-distended. No HSM noted. No guarding or rebound. No masses appreciated.  Rectal:  Deferred  Msk:  Symmetrical without gross deformities. Normal posture. Extremities:  Without edema. Neurologic:  Alert and  oriented x4;  grossly normal neurologically. Skin:  Intact without significant lesions or rashes. Psych:  Alert and cooperative. Normal mood and affect.    Assessment:     Plan:  ***   Shana Daring, PA-C [facility] 04/11/2024

## 2024-04-11 ENCOUNTER — Encounter: Payer: Self-pay | Admitting: *Deleted

## 2024-04-11 ENCOUNTER — Ambulatory Visit (INDEPENDENT_AMBULATORY_CARE_PROVIDER_SITE_OTHER): Payer: Self-pay | Admitting: Gastroenterology

## 2024-04-11 ENCOUNTER — Encounter: Payer: Self-pay | Admitting: Gastroenterology

## 2024-04-11 VITALS — BP 113/77 | HR 66 | Temp 97.9°F | Ht 73.0 in | Wt 226.0 lb

## 2024-04-11 DIAGNOSIS — F1090 Alcohol use, unspecified, uncomplicated: Secondary | ICD-10-CM | POA: Diagnosis not present

## 2024-04-11 DIAGNOSIS — R7401 Elevation of levels of liver transaminase levels: Secondary | ICD-10-CM

## 2024-04-11 DIAGNOSIS — R7989 Other specified abnormal findings of blood chemistry: Secondary | ICD-10-CM

## 2024-04-11 NOTE — Patient Instructions (Signed)
 We will arrange to have an ultrasound of your abdomen at [facility].  I suspect that your mild liver enzyme elevation is secondary to chronic alcohol use.  I recommend that you work towards complete abstinence of alcohol for now so we can see if your liver enzymes normalize and to help prevent development of liver fibrosis and cirrhosis.  Other nutritional recommendations:  Low fat/cholesterol diet.   Avoid sweets, sodas, fruit juices, sweetened beverages like tea, etc. Gradually increase exercise from 15 min daily up to 1 hr per day 5 days/week.  I will plan to see you back in 3 months or sooner if needed.  I would like for you to have liver enzymes checked 1 week prior to your follow-up.  It was nice to meet you today!  Shana Daring, PA-C [facility]

## 2024-04-13 ENCOUNTER — Ambulatory Visit (HOSPITAL_COMMUNITY)
Admission: RE | Admit: 2024-04-13 | Discharge: 2024-04-13 | Disposition: A | Discharge status: Home / Self Care | Source: Ambulatory Visit | Attending: Gastroenterology | Admitting: Gastroenterology

## 2024-04-13 DIAGNOSIS — R7989 Other specified abnormal findings of blood chemistry: Secondary | ICD-10-CM | POA: Diagnosis present

## 2024-04-22 ENCOUNTER — Ambulatory Visit: Payer: Self-pay | Admitting: Gastroenterology

## 2024-06-08 ENCOUNTER — Encounter: Payer: Self-pay | Admitting: Gastroenterology

## 2024-08-29 ENCOUNTER — Ambulatory Visit: Payer: Self-pay | Admitting: Family Medicine

## 2024-09-17 ENCOUNTER — Encounter: Payer: Self-pay | Admitting: Gastroenterology

## 2024-12-04 ENCOUNTER — Encounter: Payer: Commercial Managed Care - PPO | Admitting: Family Medicine

## 2024-12-11 ENCOUNTER — Ambulatory Visit: Admitting: Internal Medicine
